# Patient Record
Sex: Female | Born: 1970 | Race: White | Hispanic: No | Smoking: Never smoker
Health system: Southern US, Community
[De-identification: ages and names within clinical notes are randomized; demographics above are authoritative.]

## PROBLEM LIST (undated history)

## (undated) DIAGNOSIS — F419 Anxiety disorder, unspecified: Secondary | ICD-10-CM

## (undated) DIAGNOSIS — G5620 Lesion of ulnar nerve, unspecified upper limb: Secondary | ICD-10-CM

## (undated) HISTORY — DX: Anxiety disorder, unspecified: F41.9

## (undated) HISTORY — DX: Lesion of ulnar nerve, unspecified upper limb: G56.20

## (undated) HISTORY — PX: TUBAL LIGATION: SHX77

---

## 2007-01-06 IMAGING — CR DG FOREARM 2V*L*
2 series · 2 of 2 positions shown · non-contrast
Comparison: none

CLINICAL DATA: Patient fell and is having forearm pain in the mid and distal forearm.
 LEFT [id number] VIEW:
 No prior studies.

[w forearm ap left]
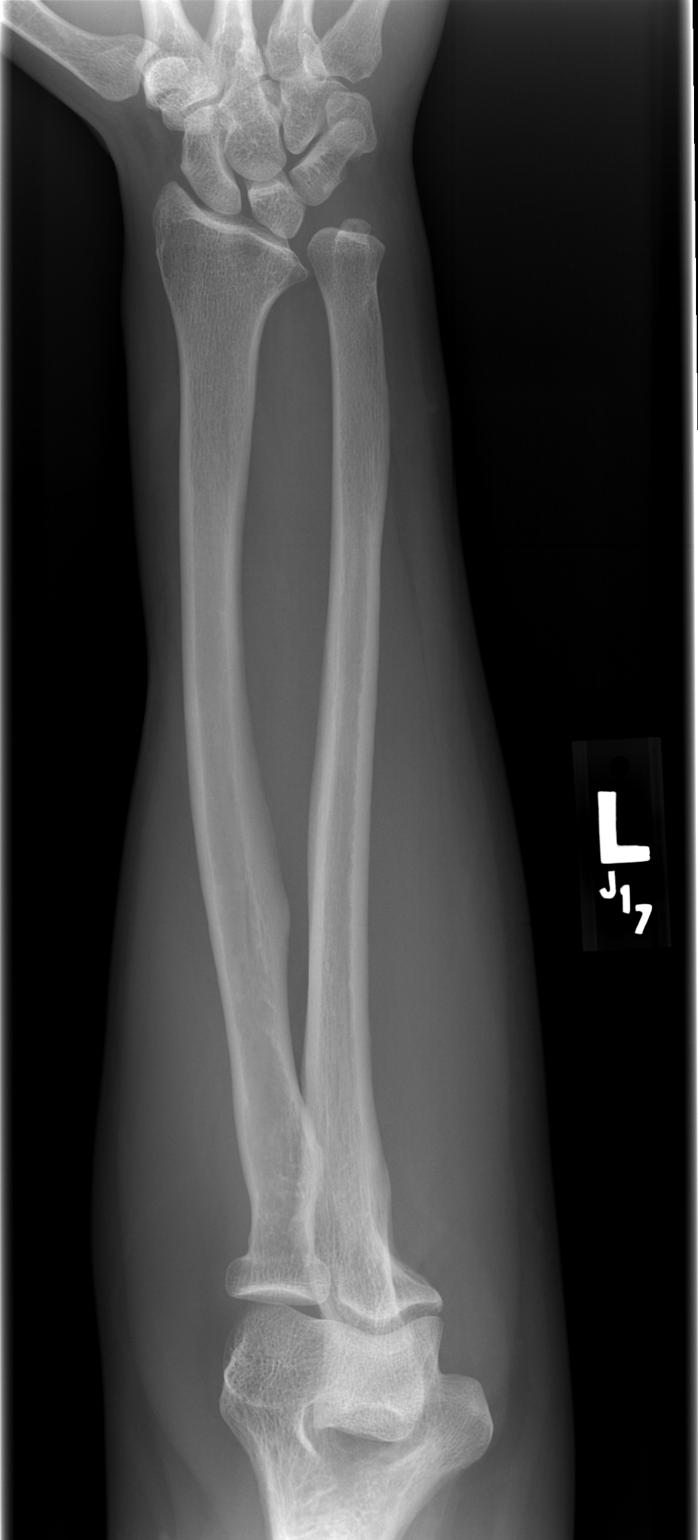

[w forearm lat left]
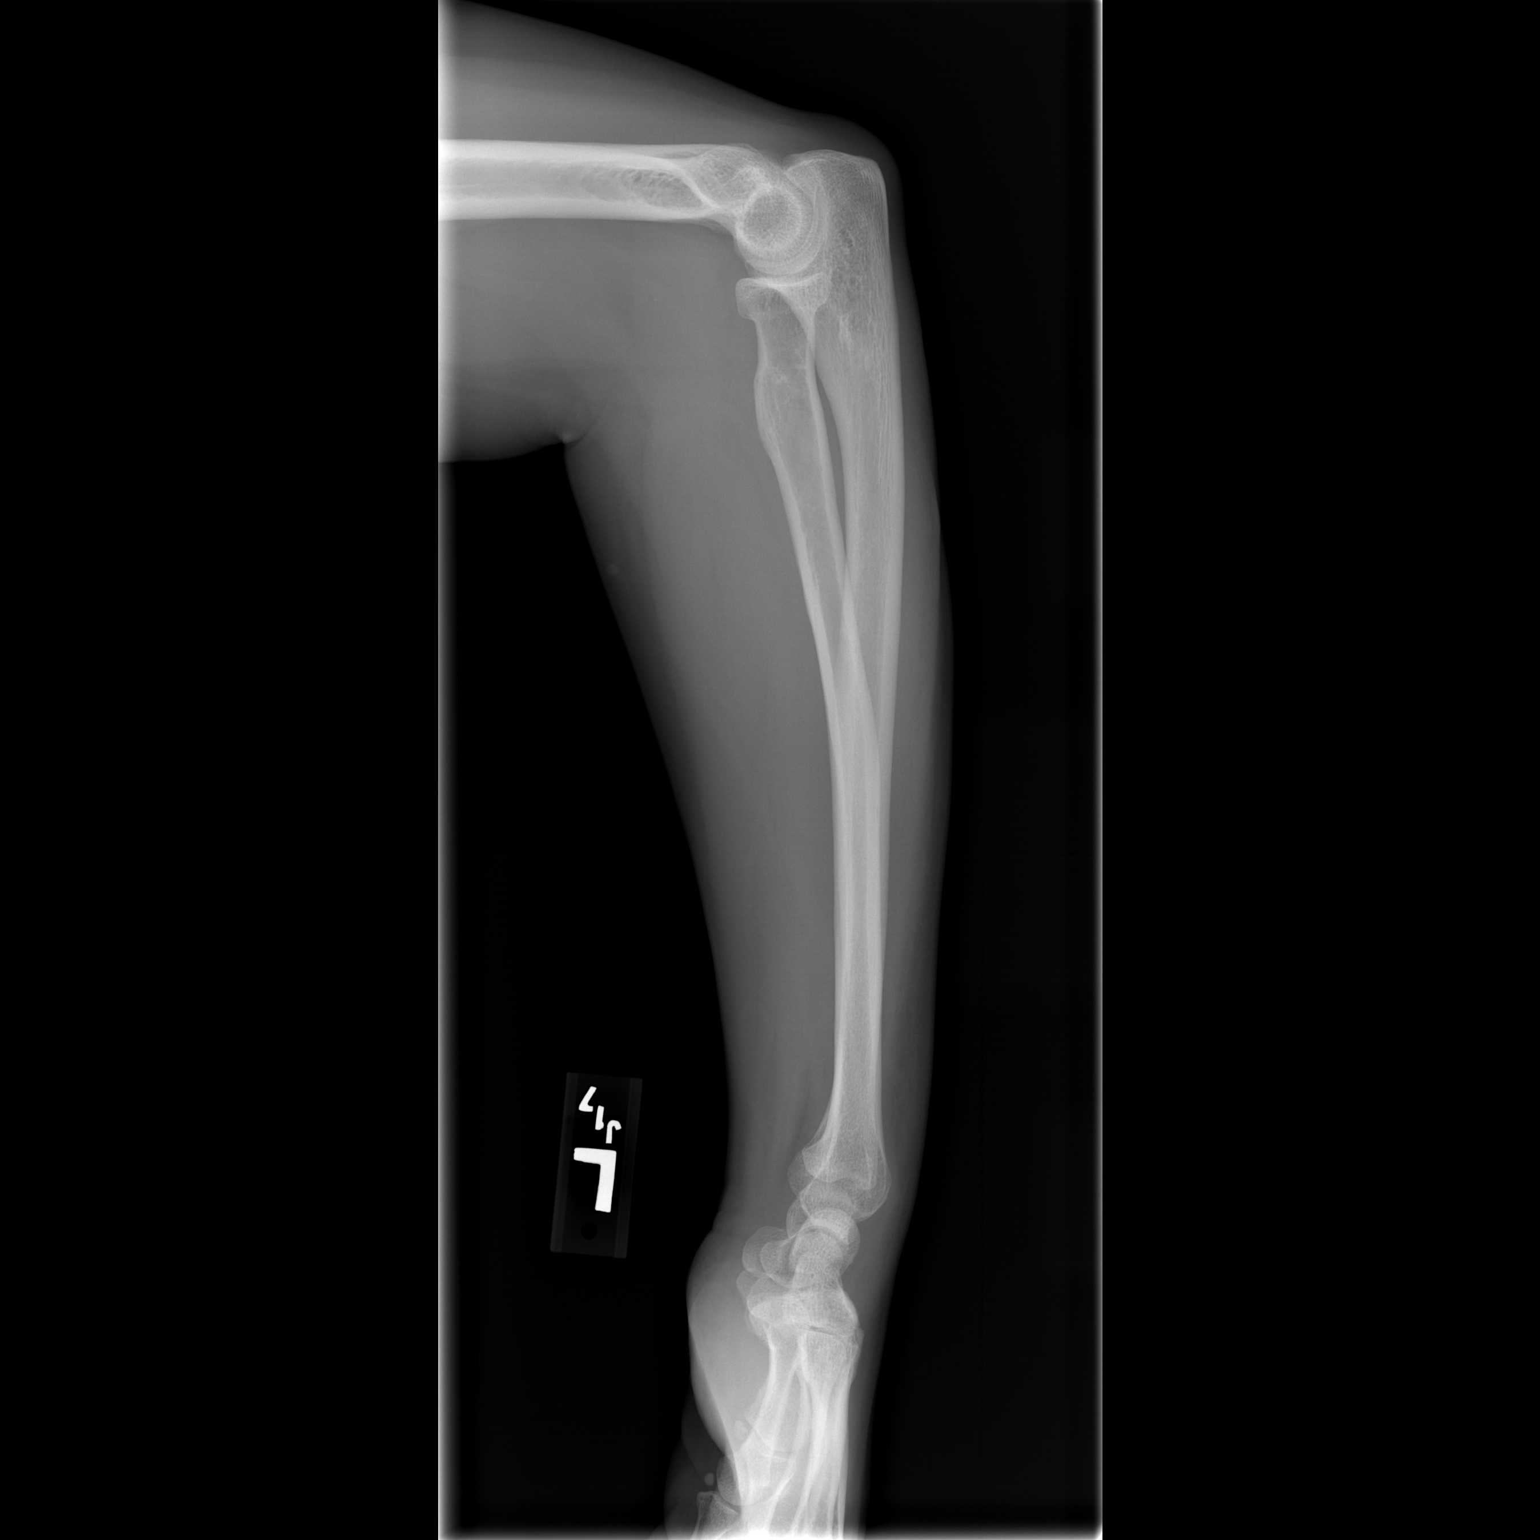

[2 of 2 positions shown; findings below may reference images not displayed]

FINDINGS: The medial portion of the distal radius has a somewhat scalloped appearance and deviates proximally, with an appearance suspicious for deformity associated with remote fracture.  Correlate with any history of fracture.  The lunate has a spurred and convex inferior margin which contours to the form of this unusual distal radius.  There is clearly some soft tissue swelling along the wrist.  No shaft fracture of the radius or ulna is identified.
IMPRESSION: Appearance suggesting deformity of the distal radius, with flattening of the medial aspect of the distal radius and proximal angulation of the articular surface of the medial radius.  This may well be old given the shape of the lunate, but there is clearly some regional soft tissue swelling.  Consider CT or MR for further evaluation.
 LEFT [id number] VIEW:
FINDINGS: Again noted is deformity of the distal radius with proximal angulation of the medial radial articular surface, inferior spurring of the lunate, and possibly widening of the distal radial ulnar articulation. No obvious cortical discontinuity but there is regional soft tissue swelling.  Cannot exclude injury of the distal radial ulnar joint. Consider MRI for further characterization.
IMPRESSION: Abnormal scalloped contour at the medial distal radial articular surface, with regional soft tissue swelling. Consider MRI if pain persists.

## 2015-03-11 DIAGNOSIS — G43119 Migraine with aura, intractable, without status migrainosus: Secondary | ICD-10-CM | POA: Insufficient documentation

## 2015-03-11 DIAGNOSIS — G4440 Drug-induced headache, not elsewhere classified, not intractable: Secondary | ICD-10-CM | POA: Insufficient documentation

## 2016-06-21 DIAGNOSIS — R102 Pelvic and perineal pain: Secondary | ICD-10-CM | POA: Insufficient documentation

## 2018-10-22 ENCOUNTER — Other Ambulatory Visit: Payer: Self-pay

## 2018-10-22 ENCOUNTER — Ambulatory Visit: Payer: Managed Care, Other (non HMO) | Admitting: Neurology

## 2018-10-22 ENCOUNTER — Encounter: Payer: Self-pay | Admitting: Neurology

## 2018-10-22 VITALS — BP 134/67 | HR 93 | Ht 66.0 in | Wt 218.0 lb

## 2018-10-22 DIAGNOSIS — G43119 Migraine with aura, intractable, without status migrainosus: Secondary | ICD-10-CM

## 2018-10-22 MED ORDER — UBRELVY 50 MG PO TABS: 50.0000 mg | ORAL_TABLET | ORAL | 3 refills | Status: AC | PRN

## 2018-10-22 NOTE — Progress Notes (Signed)
Subjective:    Patient ID: Sheila Rush is a 48 y.o. female.  HPI     Sheila FoleySaima Ej Pinson, MD, PhD [facility] [address], [address] [address] [place], [place] [id number]  Dear Dr. Yetta FlockHodges, I saw your patient, Sheila Rush, upon your kind request in my neurologic clinic today for initial consultation of her recurrent headaches, concern for migraines.  The patient is unaccompanied today.  As you know, Sheila Rush is a 48 year old right-handed woman with an underlying medical history of anxiety, and obesity, who reports recurrent headaches for the past several years.  I reviewed your office note from 08/05/2018.  She has been tried on several medications.  She has had a brain MRA recently.  We [facility] the report from [facility].  She had a brain MRA without contrast on 07/26/2018 and I reviewed the results: Impression: Normal intracranial MRA.  No aneurysm.  She reports a prior diagnosis of an aneurysm.  I reviewed prior records.  She reports having [facility] neurolog[facility] the [facility].  I was able to review a consultation note by Dr[facility] Loyal Gamblereborah Kirby [facility] from 03/11/[facility].  Patient was treated for migraine prevention with Topamax at the time and was also given a prescription for tizanidine in March 2017.  No other records are available for my review today.  Patient reports that she has tried multiple triptan's but she had a reaction including throat swelling to sumatriptan and.  She has tried Maxalt in the past as per prior records.  She was also tried on Compazine in the past, took Excedrin over-the-counter as well as Tylenol over-the-counter and there was mentioning of analgesic rebound headache.  She had a prescription for tramadol in the past.  She was on Cymbalta and clonazepam in the recent past for anxiety and depression.  She reports that this was situational.  She is no longer on Cymbalta or clonazepam.  She reports that she did not get a refill for  the Cymbalta.  She is divorced since April 2020.  She lives alone.  She does snore and has woken herself up with snoring.  She has 3 grown children.  She works for [employer].  She denies morning headaches or night to night nocturia.  She has never had a sleep study.  Her headache frequency is currently around 8/month with migrainous components including photophobia, occasional nausea, typically no vomiting, throbbing headache reported, mostly left-sided.  She has had migraines for years.  She reports that she had migraine onset in the early 7240s.  [facility] indicate that she had migraines since her 30s.  She reports no associated neurological accompaniments, typically does have a visual aura.  She gets her eyes checked every 2 years and is due this year.  She has prescription eyeglasses but finds them too strong and uses over-the-counter readers.  She is no longer on butalbital.  She was started on gabapentin and Toprol all about 2 months ago she indicates.  She believes that these are helpful.  She is taking gabapentin 300 mg at night and metoprolol 25 mg daily.  She has been using as needed Benadryl.   Her Epworth sleepiness score is 2 out of 24, fatigue severity score is 10 out of 63.  She does not believe she has tried amitriptyline for headache prevention or propranolol.  She has not been on Depakote but did get a Depacon infusion once as per prior record.  She was also treated in the past with Compazine and steroid Dosepak.    Her Past Medical History Is Significant For: Past Medical History:  Diagnosis Date  . Anxiety   . Ulnar neuropathy     Her Past Surgical History Is Significant For: Past Surgical History:  Procedure Laterality Date  . TUBAL LIGATION      Her Family History Is Significant For: History reviewed. No pertinent family history.  Her Social History Is Significant For: Social History   Socioeconomic History  . Marital status:  Divorced    Spouse name: Not on file  . Number of children: Not on file  . Years of education: Not on file  . Highest education level: Not on file  Occupational History  . Not on file  Social Needs  . Financial resource strain: Not on file  . Food insecurity    Worry: Not on file    Inability: Not on file  . Transportation needs    Medical: Not on file    Non-medical: Not on file  Tobacco Use  . Smoking status: Never Smoker  . Smokeless tobacco: Never Used  Substance and Sexual Activity  . Alcohol use: Never    Frequency: Never  . Drug use: Not on file  . Sexual activity: Not on file  Lifestyle  . Physical activity    Days per week: Not on file    Minutes per session: Not on file  . Stress: Not on file  Relationships  . Social Herbalist on phone: Not on file    Gets together: Not on file    Attends religious service: Not on file    Active member of club or organization: Not on file    Attends meetings of clubs or organizations: Not on file    Relationship status: Not on file  Other Topics Concern  . Not on file  Social History Narrative  . Not on file    Her Allergies Are:  Allergies  Allergen Reactions  . Imitrex [Sumatriptan]   . Sulfa Antibiotics   :   Her Current Medications Are:  Outpatient Encounter Medications as of 10/22/2018  Medication Sig  . diphenhydrAMINE (BENADRYL) 25 MG tablet Take 25 mg by mouth every 6 (six) hours as needed.  . gabapentin (NEURONTIN) 100 MG capsule Take 100 mg by mouth at bedtime.   . metoprolol succinate (TOPROL-XL) 25 MG 24 hr tablet Take 25 mg by mouth daily.  Marland Kitchen Ubrogepant (UBRELVY) 50 MG TABS Take 50 mg by mouth as needed (take one at onset of migraine, may repeat once after 2 hours, no more than 2 pills in 24 h.).  . [DISCONTINUED] Butalbital-Acetaminophen 50-300 MG CAPS Take by mouth.  . [DISCONTINUED] SUMAtriptan (IMITREX) 100 MG tablet Take 100 mg by mouth every 2 (two) hours as needed for migraine. May  repeat in 2 hours if headache persists or recurs.   No facility-administered encounter medications on file as of 10/22/2018.   :   Review of Systems:  Out of a complete 14 point review of systems, all are reviewed and negative with the exception of these symptoms as listed below:  Review of Systems  Neurological:       Pt presents today to discuss her migraines. Pt gets at least 8 migraines per month that last more than four hours. Pt has associated light and sound sensitivity. Pt does have nausea and vomiting with her migraine. Pt tried imitrex but it caused trouble  breathing. Pt has never had a sleep study but does endorse snoring.  Epworth Sleepiness Scale 0= would never doze 1= slight chance of dozing 2= moderate chance of dozing 3= high chance of dozing  Sitting and reading: 0 Watching TV: 1 Sitting inactive in a public place (ex. Theater or meeting):0 As a passenger in a car for an hour without a break: 0 Lying down to rest in the afternoon: 1 Sitting and talking to someone: 0 Sitting quietly after lunch (no alcohol): 0 In a car, while stopped in traffic: 0 Total: 2     Objective:  Neurological Exam  Physical Exam Physical Examination:   Vitals:   10/22/18 1004  BP: 134/67  Pulse: 93   General Examination: The patient is a very pleasant 48 y.o. female in no acute distress. She appears well-developed and well-nourished and well groomed.   HEENT: Normocephalic, atraumatic, pupils are equal, round and reactive to light and accommodation. Funduscopic exam is normal with sharp disc margins noted. Extraocular tracking is good without limitation to gaze excursion or nystagmus noted. Normal smooth pursuit is noted. Hearing is grossly intact. Face is symmetric with normal facial animation and normal facial sensation. Speech is clear with no dysarthria noted. There is no hypophonia. There is no lip, neck/head, jaw or voice tremor. Neck is supple with full range of passive and  active motion. There are no carotid bruits on auscultation. Oropharynx exam reveals: mild mouth dryness, adequate dental hygiene and mild airway crowding, due to Small airway entry, tongue protrudes centrally in palate elevates symmetrically.  Neck circumference is 15-5/8 inches.  Tonsils are small.  Chest: Clear to auscultation without wheezing, rhonchi or crackles noted.  Heart: S1+S2+0, regular and normal without murmurs, rubs or gallops noted.   Abdomen: Soft, non-tender and non-distended with normal bowel sounds appreciated on auscultation.  Extremities: There is no pitting edema in the distal lower extremities bilaterally.  Skin: Warm and dry without trophic changes noted.  Musculoskeletal: exam reveals no obvious joint deformities, tenderness or joint swelling or erythema.   Neurologically:  Mental status: The patient is awake, alert and oriented in all 4 spheres. Her immediate and remote memory, attention, language skills and fund of knowledge are appropriate. There is no evidence of aphasia, agnosia, apraxia or anomia. Speech is clear with normal prosody and enunciation. Thought process is linear. Mood is normal and affect is normal.  Cranial nerves II - XII are as described above under HEENT exam. In addition: shoulder shrug is normal with equal shoulder height noted. Motor exam: Normal bulk, strength and tone is noted. There is no drift, tremor or rebound. Romberg is negative. Reflexes are 2+ throughout. Babinski: Toes are flexor bilaterally. Fine motor skills and coordination: intact with normal finger taps, normal hand movements, normal rapid alternating patting, normal foot taps and normal foot agility.  Cerebellar testing: No dysmetria or intention tremor on finger to nose testing. Heel to shin is unremarkable bilaterally. There is no truncal or gait ataxia.  Sensory exam: intact to light touch in the upper and lower extremities.  Gait, station and balance: She stands easily. No  veering to one side is noted. No leaning to one side is noted. Posture is age-appropriate and stance is narrow based. Gait shows normal stride length and normal pace. No problems turning are noted. Tandem walk is unremarkable.   Assessment and Plan:  In summary, Sheila Rush is a very pleasant 48 y.o.-year old female with an underlying medical history of anxiety, and  obesity, who Presents for evaluation of her recurrent headaches.  Her history is suggestive of migraines with aura, currently intractable without status.  She has been tried on Topamax in the past, she used to see Dr. Craige Cotta at [facility].  She has tried several different triptans at least Maxalt and sumatriptan hand.  She had side effects with the sumatriptan hand.  She has tried over-the-counter medications and also Compazine for nausea and had treatment with prednisone Dosepak and IV valproic acid in the past.  She is currently on a beta-blocker and gabapentin which were recently started within the past 2 months or so.  She has noticed some improvement in the intensity of her headache.  She has not tried 1 of the newer as needed medications, to that end, I suggested she start Ubrelvy 50 mg prnFor acute migraine.  For prevention we can consider 1 of the newer injectables such as Aimovig or Emgality or AjovyFor future reference.  She is for now advised to continue with her preventative medications as prescribed by you. Given her obesity and snoring and increase in headaches she should consider a sleep study to rule out obstructive sleep apnea.  She may be at risk for it and she was advised to proceed with a sleep study.  She declines it but is encouraged to consider it.  She is no longer on Cymbalta but indicates that she benefited from it at the time and ran out of the prescription.  She is encouraged to talk to about management of her anxiety and depression. She reportedly had a prior MRI.  She also reports that she was told she had a brain  aneurysm, small as far she recalls.  She had a recent MRA which did not suggest any aneurysm.  Prior office note from Dr. Craige Cotta indicates that she had a venous anomaly.  Her neurological exam is nonfocal and she is reassured.  She is advised to follow through with her eye examination as she goes every 2 years and she may be due at this time.I advised her to follow-up in 3 months with 1 of our nurse practitioners.  I provided a new prescription and written instructions for Ubrelvy.  We talked about headache triggers.  She has noted barometric pressure changes as triggers as well as sleep deprivation and caffeine.  For now she will follow-up in 3 months, she is encouraged to call our office with any interim questions or concerns or if she is ready to pursue a sleep study.  I answered all her questions today and she was in agreement. Thank you very much for allowing me to participate in the care of this nice patient. If I can be of any further assistance to you please do not hesitate to call me at [phone].  Sincerely,   Sheila Foley, MD, PhD

## 2018-10-22 NOTE — Patient Instructions (Signed)
Please continue with the gabapentin and metoprolol as prescribed by Dr. Nyra Capes.  For as needed use for migraine: Start Ubrelvy, 50 mg strength: Take 1 pill at onset of migraine headache, may repeat in 2 hours, no more than 2 pills in 24 hours for now. May cause sedation and nausea.  We can consider 1 of the newer injectable medications, including Aimovig, Emgality, and Ajovy migraine prevention in the future.  Please think about doing a sleep study to rule out obstructive sleep apnea which can cause flareup and headaches and increased migraines. You may be at risk for sleep apnea.

## 2019-02-05 ENCOUNTER — Ambulatory Visit: Payer: Managed Care, Other (non HMO) | Admitting: Family Medicine

## 2021-06-29 ENCOUNTER — Encounter: Payer: Self-pay | Admitting: Family Medicine

## 2021-08-16 DIAGNOSIS — F4310 Post-traumatic stress disorder, unspecified: Secondary | ICD-10-CM | POA: Diagnosis not present

## 2021-08-16 DIAGNOSIS — Z6837 Body mass index (BMI) 37.0-37.9, adult: Secondary | ICD-10-CM | POA: Diagnosis not present

## 2021-10-20 DIAGNOSIS — Z23 Encounter for immunization: Secondary | ICD-10-CM | POA: Diagnosis not present

## 2021-10-20 DIAGNOSIS — Z13220 Encounter for screening for lipoid disorders: Secondary | ICD-10-CM | POA: Diagnosis not present

## 2021-10-20 DIAGNOSIS — Z0000 Encounter for general adult medical examination without abnormal findings: Secondary | ICD-10-CM | POA: Diagnosis not present

## 2021-10-20 DIAGNOSIS — Z6837 Body mass index (BMI) 37.0-37.9, adult: Secondary | ICD-10-CM | POA: Diagnosis not present

## 2021-10-20 DIAGNOSIS — Z131 Encounter for screening for diabetes mellitus: Secondary | ICD-10-CM | POA: Diagnosis not present

## 2021-10-20 DIAGNOSIS — Z79899 Other long term (current) drug therapy: Secondary | ICD-10-CM | POA: Diagnosis not present

## 2021-11-05 DIAGNOSIS — Z1212 Encounter for screening for malignant neoplasm of rectum: Secondary | ICD-10-CM | POA: Diagnosis not present

## 2021-11-05 DIAGNOSIS — Z1211 Encounter for screening for malignant neoplasm of colon: Secondary | ICD-10-CM | POA: Diagnosis not present

## 2021-12-06 DIAGNOSIS — Z6838 Body mass index (BMI) 38.0-38.9, adult: Secondary | ICD-10-CM | POA: Diagnosis not present

## 2021-12-06 DIAGNOSIS — F4310 Post-traumatic stress disorder, unspecified: Secondary | ICD-10-CM | POA: Diagnosis not present

## 2021-12-06 DIAGNOSIS — G43109 Migraine with aura, not intractable, without status migrainosus: Secondary | ICD-10-CM | POA: Diagnosis not present

## 2021-12-19 DIAGNOSIS — G43109 Migraine with aura, not intractable, without status migrainosus: Secondary | ICD-10-CM | POA: Diagnosis not present

## 2021-12-19 DIAGNOSIS — Z6838 Body mass index (BMI) 38.0-38.9, adult: Secondary | ICD-10-CM | POA: Diagnosis not present

## 2022-02-06 DIAGNOSIS — U071 COVID-19: Secondary | ICD-10-CM | POA: Diagnosis not present

## 2022-04-28 DIAGNOSIS — G932 Benign intracranial hypertension: Secondary | ICD-10-CM | POA: Diagnosis not present

## 2022-07-06 DIAGNOSIS — E663 Overweight: Secondary | ICD-10-CM | POA: Diagnosis not present

## 2022-07-06 DIAGNOSIS — E559 Vitamin D deficiency, unspecified: Secondary | ICD-10-CM | POA: Diagnosis not present

## 2022-07-06 DIAGNOSIS — G932 Benign intracranial hypertension: Secondary | ICD-10-CM | POA: Diagnosis not present

## 2022-07-06 DIAGNOSIS — G4489 Other headache syndrome: Secondary | ICD-10-CM | POA: Diagnosis not present

## 2022-07-06 DIAGNOSIS — R7989 Other specified abnormal findings of blood chemistry: Secondary | ICD-10-CM | POA: Diagnosis not present

## 2022-07-06 DIAGNOSIS — G43E19 Chronic migraine with aura, intractable, without status migrainosus: Secondary | ICD-10-CM | POA: Diagnosis not present

## 2022-07-06 DIAGNOSIS — R519 Headache, unspecified: Secondary | ICD-10-CM | POA: Diagnosis not present

## 2022-07-07 DIAGNOSIS — E663 Overweight: Secondary | ICD-10-CM | POA: Insufficient documentation

## 2022-07-07 DIAGNOSIS — G932 Benign intracranial hypertension: Secondary | ICD-10-CM | POA: Insufficient documentation

## 2022-07-07 DIAGNOSIS — E559 Vitamin D deficiency, unspecified: Secondary | ICD-10-CM | POA: Insufficient documentation

## 2022-07-07 DIAGNOSIS — R7989 Other specified abnormal findings of blood chemistry: Secondary | ICD-10-CM | POA: Insufficient documentation

## 2022-07-07 DIAGNOSIS — G43E19 Chronic migraine with aura, intractable, without status migrainosus: Secondary | ICD-10-CM | POA: Insufficient documentation

## 2022-09-22 ENCOUNTER — Encounter: Payer: Self-pay | Admitting: Cardiology

## 2022-09-26 ENCOUNTER — Ambulatory Visit: Payer: BC Managed Care – PPO | Attending: Cardiology | Admitting: Cardiology

## 2022-09-26 ENCOUNTER — Ambulatory Visit: Payer: BC Managed Care – PPO | Attending: Cardiology

## 2022-09-26 VITALS — BP 112/78 | HR 77 | Ht 64.0 in | Wt 235.2 lb

## 2022-09-26 DIAGNOSIS — E785 Hyperlipidemia, unspecified: Secondary | ICD-10-CM | POA: Diagnosis not present

## 2022-09-26 DIAGNOSIS — R002 Palpitations: Secondary | ICD-10-CM

## 2022-09-26 DIAGNOSIS — Z7689 Persons encountering health services in other specified circumstances: Secondary | ICD-10-CM

## 2022-09-26 DIAGNOSIS — R0789 Other chest pain: Secondary | ICD-10-CM | POA: Diagnosis not present

## 2022-09-26 DIAGNOSIS — R072 Precordial pain: Secondary | ICD-10-CM

## 2022-09-26 DIAGNOSIS — R0609 Other forms of dyspnea: Secondary | ICD-10-CM | POA: Diagnosis not present

## 2022-09-26 MED ORDER — METOPROLOL TARTRATE 100 MG PO TABS: ORAL_TABLET | ORAL | 0 refills | Status: DC

## 2022-09-26 MED ORDER — ASPIRIN 81 MG PO TBEC: 81.0000 mg | DELAYED_RELEASE_TABLET | Freq: Every day | ORAL | Status: AC

## 2022-09-26 NOTE — Addendum Note (Signed)
Addended by: Baldo Ash D on: 09/26/2022 11:03 AM   Modules accepted: Orders

## 2022-09-26 NOTE — Patient Instructions (Signed)
Medication Instructions:   START: Aspirin 81mg  1 tablet daily  TAKE: Metoprolol 100mg  1 tablet 2 hours prior to CT Scan  *If you need a refill on your cardiac medications before your next appointment, please call your pharmacy*   Lab Work: None ordered If you have labs (blood work) drawn today and your tests are completely normal, you will receive your results only by: MyChart Message (if you have MyChart) OR A paper copy in the mail If you have any lab test that is abnormal or we need to change your treatment, we will call you to review the results.   Testing/Procedures:  Your physician has requested that you have an echocardiogram. Echocardiography is a painless test that uses sound waves to create images of your heart. It provides your doctor with information about the size and shape of your heart and how well your heart's chambers and valves are working. This procedure takes approximately one hour. There are no restrictions for this procedure. Please do NOT wear cologne, perfume, aftershave, or lotions (deodorant is allowed). Please arrive 15 minutes prior to your appointment time.    WHY IS MY DOCTOR PRESCRIBING ZIO? The Zio system is proven and trusted by physicians to detect and diagnose irregular heart rhythms -- and has been prescribed to hundreds of thousands of patients.  The FDA has cleared the Zio system to monitor for many different kinds of irregular heart rhythms. In a study, physicians were able to reach a diagnosis 90% of the time with the Zio system1.  You can wear the Zio monitor -- a small, discreet, comfortable patch -- during your normal day-to-day activity, including while you sleep, shower, and exercise, while it records every single heartbeat for analysis.  1Barrett, P., et al. Comparison of 24 Hour Holter Monitoring Versus 14 Day Novel Adhesive Patch Electrocardiographic Monitoring. American Journal of Medicine, 2014.  ZIO VS. HOLTER MONITORING The Zio  monitor can be comfortably worn for up to 14 days. Holter monitors can be worn for 24 to 48 hours, limiting the time to record any irregular heart rhythms you may have. Zio is able to capture data for the 51% of patients who have their first symptom-triggered arrhythmia after 48 hours.1  LIVE WITHOUT RESTRICTIONS The Zio ambulatory cardiac monitor is a small, unobtrusive, and water-resistant patch--you might even forget you're wearing it. The Zio monitor records and stores every beat of your heart, whether you're sleeping, working out, or showering.   Your physician has requested that you have cardiac CT. Cardiac computed tomography (CT) is a painless test that uses an x-ray machine to take clear, detailed pictures of your heart. For further information please visit [email or web address] Please follow instruction sheet as given.    Your Cardiac CT will be scheduled at:   [facility] located off [facility] at the hospital.  Please arrive 30 minutes prior to your appointment time.  You can use the FREE valet parking offered at entrance to outpatient center (encouraged to control the heart rate for the test)   Please follow these instructions carefully (unless otherwise directed):    On the Night Before the Test: Be sure to Drink plenty of water. Do not consume any caffeinated/decaffeinated beverages or chocolate 12 hours prior to your test. Do not take any antihistamines 12 hours prior to your test.   On the Day of the Test: Drink plenty of water until 1 hour prior to the test. Do not eat any food 4 hours prior to the  test. No smoking 4 hours prior to test. You may take your regular medications prior to the test.  Take metoprolol (Lopressor) two hours prior to test. FEMALES- please wear underwire-free bra if available, avoid dresses & tight clothing. Wear plain shirt no beads, sparkles, rhinestones, metal or heavy embroidery.  After the Test: Drink plenty of  water. After receiving IV contrast, you may experience a mild flushed feeling. This is normal. On occasion, you may experience a mild rash up to 24 hours after the test. This is not dangerous. If this occurs, you can take Benadryl 25 mg and increase your fluid intake. If you experience trouble breathing, this can be serious. If it is severe call 911 IMMEDIATELY. If it is mild, please call our office. If you take any of these medications: Glipizide/Metformin, Avandament, Glucavance, please do not take 48 hours after completing test unless otherwise instructed.  We will call to schedule your test 2-4 weeks out understanding that some insurance companies will need an authorization prior to the service being performed.      Follow-Up: At [facility], you and your health needs are our priority.  As part of our continuing mission to provide you with exceptional heart care, we have created designated Provider Care Teams.  These Care Teams include your primary Cardiologist (physician) and Advanced Practice Providers (APPs -  Physician Assistants and Nurse Practitioners) who all work together to provide you with the care you need, when you need it.  We recommend signing up for the patient portal called "MyChart".  Sign up information is provided on this After Visit Summary.  MyChart is used to connect with patients for Virtual Visits (Telemedicine).  Patients are able to view lab/test results, encounter notes, upcoming appointments, etc.  Non-urgent messages can be sent to your provider as well.   To learn more about what you can do with MyChart, go to [email or web address].    Your next appointment:   2 month(s)  The format for your next appointment:   In Person  Provider:   Gypsy Balsam, MD    Other Instructions Cardiac CT Angiogram A cardiac CT angiogram is a procedure to look at the heart and the area around the heart. It may be done to help find the cause of chest pains or  other symptoms of heart disease. During this procedure, a substance called contrast dye is injected into the blood vessels in the area to be checked. A large X-ray machine, called a CT scanner, then takes detailed pictures of the heart and the surrounding area. The procedure is also sometimes called a coronary CT angiogram, coronary artery scanning, or CTA. A cardiac CT angiogram allows the health care provider to see how well blood is flowing to and from the heart. The health care provider will be able to see if there are any problems, such as: Blockage or narrowing of the coronary arteries in the heart. Fluid around the heart. Signs of weakness or disease in the muscles, valves, and tissues of the heart. Tell a health care provider about: Any allergies you have. This is especially important if you have had a previous allergic reaction to contrast dye. All medicines you are taking, including vitamins, herbs, eye drops, creams, and over-the-counter medicines. Any blood disorders you have. Any surgeries you have had. Any medical conditions you have. Whether you are pregnant or may be pregnant. Any anxiety disorders, chronic pain, or other conditions you have that may increase your stress or prevent you from  lying still. What are the risks? Generally, this is a safe procedure. However, problems may occur, including: Bleeding. Infection. Allergic reactions to medicines or dyes. Damage to other structures or organs. Kidney damage from the contrast dye that is used. Increased risk of cancer from radiation exposure. This risk is low. Talk with your health care provider about: The risks and benefits of testing. How you can receive the lowest dose of radiation. What happens before the procedure? Wear comfortable clothing and remove any jewelry, glasses, dentures, and hearing aids. Follow instructions from your health care provider about eating and drinking. This may include: For 12 hours before the  procedure -- avoid caffeine. This includes tea, coffee, soda, energy drinks, and diet pills. Drink plenty of water or other fluids that do not have caffeine in them. Being well hydrated can prevent complications. For 4-6 hours before the procedure -- stop eating and drinking. The contrast dye can cause nausea, but this is less likely if your stomach is empty. Ask your health care provider about changing or stopping your regular medicines. This is especially important if you are taking diabetes medicines, blood thinners, or medicines to treat problems with erections (erectile dysfunction). What happens during the procedure?  Hair on your chest may need to be removed so that small sticky patches called electrodes can be placed on your chest. These will transmit information that helps to monitor your heart during the procedure. An IV will be inserted into one of your veins. You might be given a medicine to control your heart rate during the procedure. This will help to ensure that good images are obtained. You will be asked to lie on an exam table. This table will slide in and out of the CT machine during the procedure. Contrast dye will be injected into the IV. You might feel warm, or you may get a metallic taste in your mouth. You will be given a medicine called nitroglycerin. This will relax or dilate the arteries in your heart. The table that you are lying on will move into the CT machine tunnel for the scan. The person running the machine will give you instructions while the scans are being done. You may be asked to: Keep your arms above your head. Hold your breath. Stay very still, even if the table is moving. When the scanning is complete, you will be moved out of the machine. The IV will be removed. The procedure may vary among health care providers and hospitals. What can I expect after the procedure? After your procedure, it is common to have: A metallic taste in your mouth from the  contrast dye. A feeling of warmth. A headache from the nitroglycerin. Follow these instructions at home: Take over-the-counter and prescription medicines only as told by your health care provider. If you are told, drink enough fluid to keep your urine pale yellow. This will help to flush the contrast dye out of your body. Most people can return to their normal activities right after the procedure. Ask your health care provider what activities are safe for you. It is up to you to get the results of your procedure. Ask your health care provider, or the department that is doing the procedure, when your results will be ready. Keep all follow-up visits as told by your health care provider. This is important. Contact a health care provider if: You have any symptoms of allergy to the contrast dye. These include: Shortness of breath. Rash or hives. A racing heartbeat. Summary  A cardiac CT angiogram is a procedure to look at the heart and the area around the heart. It may be done to help find the cause of chest pains or other symptoms of heart disease. During this procedure, a large X-ray machine, called a CT scanner, takes detailed pictures of the heart and the surrounding area after a contrast dye has been injected into blood vessels in the area. Ask your health care provider about changing or stopping your regular medicines before the procedure. This is especially important if you are taking diabetes medicines, blood thinners, or medicines to treat erectile dysfunction. If you are told, drink enough fluid to keep your urine pale yellow. This will help to flush the contrast dye out of your body. This information is not intended to replace advice given to you by your health care provider. Make sure you discuss any questions you have with your health care provider. Document Revised: 05/05/2021 Document Reviewed: 09/11/2018 Elsevier Patient Education  2023 Elsevier Inc.   Important Information About  Sugar

## 2022-09-26 NOTE — Progress Notes (Signed)
Cardiology Consultation:    Date:  09/26/2022   ID:  Nyoka, Clippard 1970/11/01, MRN [id number]  PCP:  Abner Greenspan, MD  Cardiologist:  Gypsy Balsam, MD   Referring MD: Abner Greenspan, MD   Chief Complaint  Patient presents with   Chest Pain   elevated HR    Both ongoing for 1-2 yrs  On Metoprolol but not helping    History of Present Illness:    Sheila Rush is a 52 y.o. female who is being seen today for the evaluation of chest pain at the request of Abner Greenspan, MD. past medical history significant for anxiety, dyslipidemia, migraine headaches.  She was referred to [place] because of episode of chest pain.  She describes chest pain ongoing for about 1 to 3 years chest pain heaviness like sensation lasting sometimes all day.  She grades as 5 on a scale up to 10 mg which she gets this chest pain sessions she also will have some headache especially on the left side.  There is no some shortness of breath associated with this.  But not much swelling.  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  She have to walk a lot to have no difficulty doing it.  Does not practice any sport does not exercise on the regular basis watching her diet.  She never smoked does have family history with multiple family members having premature coronary disease.  Denies have any typical exertional chest pain.  Does have some shortness of breath while exercising  Past Medical History:  Diagnosis Date   Anxiety    Ulnar neuropathy     Past Surgical History:  Procedure Laterality Date   TUBAL LIGATION      Current Medications: Current Meds  Medication Sig   diphenhydrAMINE (BENADRYL) 25 MG tablet Take 25 mg by mouth every 6 (six) hours as needed for itching, allergies or sleep.   gabapentin (NEURONTIN) 100 MG capsule Take 100 mg by mouth at bedtime.    hydrOXYzine (ATARAX) 50 MG tablet Take 50 mg by mouth at bedtime as needed (Sleep).   metoprolol succinate (TOPROL-XL) 25 MG 24 hr tablet  Take 25 mg by mouth daily.   Ubrogepant (UBRELVY) 50 MG TABS Take 50 mg by mouth as needed (take one at onset of migraine, may repeat once after 2 hours, no more than 2 pills in 24 h.).     Allergies:   Imitrex [sumatriptan] and Sulfa antibiotics   Social History   Socioeconomic History   Marital status: Divorced    Spouse name: Not on file   Number of children: Not on file   Years of education: Not on file   Highest education level: Not on file  Occupational History   Not on file  Tobacco Use   Smoking status: Never   Smokeless tobacco: Never  Substance and Sexual Activity   Alcohol use: Never   Drug use: Not on file   Sexual activity: Not on file  Other Topics Concern   Not on file  Social History Narrative   Not on file   Social Determinants of Health   Financial Resource Strain: Not on file  Food Insecurity: Not on file  Transportation Needs: Not on file  Physical Activity: Not on file  Stress: Not on file  Social Connections: Not on file     Family History: The patient's family history is not on file. ROS:   Please see the history of present illness.  All 14 point review of systems negative except as described per history of present illness.  EKGs/Labs/Other Studies Reviewed:    The following studies were reviewed today:   EKG:   Normal sinus rhythm, normal P interval, normal QRS complex duration morphology no ST segment changes  Recent Labs: No results found for requested labs within last 365 days.  Recent Lipid Panel No results found for: "CHOL", "TRIG", "HDL", "CHOLHDL", "VLDL", "LDLCALC", "LDLDIRECT"  Physical Exam:    VS:  BP 112/78 (BP Location: Left Arm, Patient Position: Sitting)   Pulse 77   Ht 5\' 4"  (1.626 m)   Wt 235 lb 3.2 oz (106.7 kg)   SpO2 95%   BMI 40.37 kg/m     Wt Readings from Last 3 Encounters:  09/26/22 235 lb 3.2 oz (106.7 kg)  10/22/18 218 lb (98.9 kg)     GEN:  Well nourished, well developed in no acute  distress HEENT: Normal NECK: No JVD; No carotid bruits LYMPHATICS: No lymphadenopathy CARDIAC: RRR, no murmurs, no rubs, no gallops RESPIRATORY:  Clear to auscultation without rales, wheezing or rhonchi  ABDOMEN: Soft, non-tender, non-distended MUSCULOSKELETAL:  No edema; No deformity  SKIN: Warm and dry NEUROLOGIC:  Alert and oriented x 3 PSYCHIATRIC:  Normal affect   ASSESSMENT:    1. Encounter to establish care   2. Atypical chest pain   3. Palpitations   4. Dyspnea on exertion   5. Dyslipidemia    PLAN:    In order of problems listed above:  Atypical chest pain with this lady with not many risk factors for coronary artery disease only mild dyslipidemia but HDL is elevated.  Never smoked does not have hypertension.  Hemoglobin A1c slightly on the higher side but does not reach criteria for diabetes she does have however significant family history of premature coronary artery disease, therefore, I think we need to evaluate him for presence of coronary disease with her chest pain I think the best test will be coronary CT angio which I will schedule her to. Dyspnea on exertion: Echocardiogram to be done to assess left ventricle ejection fraction. Palpitations, she noticed on her watch that alarms are about fast heart rate.  It can happen anytime.  She also feels palpitations.  Will ask her to wear Zio patch for 2 weeks to see if there is any arrhythmia.  She is already on metoprolol which I will continue in the future may require higher dose of the medication. Anxiety which may be contributing to her symptomatology but before we commit her to this diagnosis I need to make sure there is no organic heart problem, therefore, we will do the above-mentioned tests.   Medication Adjustments/Labs and Tests Ordered: Current medicines are reviewed at length with the patient today.  Concerns regarding medicines are outlined above.  Orders Placed This Encounter  Procedures   EKG 12-Lead   No  orders of the defined types were placed in this encounter.   Signed, Georgeanna Lea, MD, [facility]. 09/26/2022 10:28 AM    [facility]

## 2022-09-27 DIAGNOSIS — F418 Other specified anxiety disorders: Secondary | ICD-10-CM | POA: Diagnosis not present

## 2022-09-27 DIAGNOSIS — G932 Benign intracranial hypertension: Secondary | ICD-10-CM | POA: Diagnosis not present

## 2022-09-27 DIAGNOSIS — Z6835 Body mass index (BMI) 35.0-35.9, adult: Secondary | ICD-10-CM | POA: Diagnosis not present

## 2022-10-05 DIAGNOSIS — R072 Precordial pain: Secondary | ICD-10-CM | POA: Diagnosis not present

## 2022-10-05 DIAGNOSIS — R079 Chest pain, unspecified: Secondary | ICD-10-CM | POA: Diagnosis not present

## 2022-10-11 DIAGNOSIS — R002 Palpitations: Secondary | ICD-10-CM | POA: Diagnosis not present

## 2022-10-20 ENCOUNTER — Encounter: Payer: Self-pay | Admitting: Cardiology

## 2022-10-31 ENCOUNTER — Ambulatory Visit: Payer: BC Managed Care – PPO | Attending: Cardiology

## 2022-11-03 DIAGNOSIS — G932 Benign intracranial hypertension: Secondary | ICD-10-CM | POA: Diagnosis not present

## 2022-11-03 DIAGNOSIS — E66812 Obesity, class 2: Secondary | ICD-10-CM | POA: Diagnosis not present

## 2022-11-03 DIAGNOSIS — Z23 Encounter for immunization: Secondary | ICD-10-CM | POA: Diagnosis not present

## 2022-11-03 DIAGNOSIS — N951 Menopausal and female climacteric states: Secondary | ICD-10-CM | POA: Diagnosis not present

## 2022-11-03 DIAGNOSIS — Z6836 Body mass index (BMI) 36.0-36.9, adult: Secondary | ICD-10-CM | POA: Diagnosis not present

## 2022-11-03 DIAGNOSIS — F418 Other specified anxiety disorders: Secondary | ICD-10-CM | POA: Diagnosis not present

## 2022-11-13 DIAGNOSIS — Z6838 Body mass index (BMI) 38.0-38.9, adult: Secondary | ICD-10-CM | POA: Diagnosis not present

## 2022-11-13 DIAGNOSIS — G932 Benign intracranial hypertension: Secondary | ICD-10-CM | POA: Diagnosis not present

## 2022-11-13 DIAGNOSIS — F418 Other specified anxiety disorders: Secondary | ICD-10-CM | POA: Diagnosis not present

## 2022-11-27 DIAGNOSIS — F418 Other specified anxiety disorders: Secondary | ICD-10-CM | POA: Diagnosis not present

## 2022-11-27 DIAGNOSIS — Z6838 Body mass index (BMI) 38.0-38.9, adult: Secondary | ICD-10-CM | POA: Diagnosis not present

## 2022-12-06 DIAGNOSIS — Z6838 Body mass index (BMI) 38.0-38.9, adult: Secondary | ICD-10-CM | POA: Diagnosis not present

## 2022-12-06 DIAGNOSIS — R519 Headache, unspecified: Secondary | ICD-10-CM | POA: Diagnosis not present

## 2022-12-06 DIAGNOSIS — R7989 Other specified abnormal findings of blood chemistry: Secondary | ICD-10-CM | POA: Diagnosis not present

## 2022-12-06 DIAGNOSIS — R110 Nausea: Secondary | ICD-10-CM | POA: Diagnosis not present

## 2022-12-11 ENCOUNTER — Ambulatory Visit: Payer: BC Managed Care – PPO | Admitting: Cardiology

## 2022-12-12 DIAGNOSIS — J9801 Acute bronchospasm: Secondary | ICD-10-CM | POA: Diagnosis not present

## 2022-12-14 ENCOUNTER — Other Ambulatory Visit: Payer: BC Managed Care – PPO

## 2023-02-02 DIAGNOSIS — Z79899 Other long term (current) drug therapy: Secondary | ICD-10-CM | POA: Diagnosis not present

## 2023-02-02 DIAGNOSIS — R7303 Prediabetes: Secondary | ICD-10-CM | POA: Diagnosis not present

## 2023-02-02 DIAGNOSIS — Z6838 Body mass index (BMI) 38.0-38.9, adult: Secondary | ICD-10-CM | POA: Diagnosis not present

## 2023-02-02 DIAGNOSIS — N951 Menopausal and female climacteric states: Secondary | ICD-10-CM | POA: Diagnosis not present

## 2023-03-07 DIAGNOSIS — R2689 Other abnormalities of gait and mobility: Secondary | ICD-10-CM | POA: Diagnosis not present

## 2023-03-07 DIAGNOSIS — R519 Headache, unspecified: Secondary | ICD-10-CM | POA: Diagnosis not present

## 2023-03-07 DIAGNOSIS — H538 Other visual disturbances: Secondary | ICD-10-CM | POA: Diagnosis not present

## 2023-03-07 DIAGNOSIS — Z5320 Procedure and treatment not carried out because of patient's decision for unspecified reasons: Secondary | ICD-10-CM | POA: Diagnosis not present

## 2023-03-15 DIAGNOSIS — R519 Headache, unspecified: Secondary | ICD-10-CM | POA: Diagnosis not present

## 2023-03-15 DIAGNOSIS — G932 Benign intracranial hypertension: Secondary | ICD-10-CM | POA: Diagnosis not present

## 2023-03-15 DIAGNOSIS — E236 Other disorders of pituitary gland: Secondary | ICD-10-CM | POA: Diagnosis not present

## 2023-03-15 DIAGNOSIS — Z5181 Encounter for therapeutic drug level monitoring: Secondary | ICD-10-CM | POA: Diagnosis not present

## 2023-03-15 DIAGNOSIS — H538 Other visual disturbances: Secondary | ICD-10-CM | POA: Diagnosis not present

## 2023-04-05 DIAGNOSIS — R519 Headache, unspecified: Secondary | ICD-10-CM | POA: Diagnosis not present

## 2023-04-05 DIAGNOSIS — Z6838 Body mass index (BMI) 38.0-38.9, adult: Secondary | ICD-10-CM | POA: Diagnosis not present

## 2023-04-13 DIAGNOSIS — Z6835 Body mass index (BMI) 35.0-35.9, adult: Secondary | ICD-10-CM | POA: Diagnosis not present

## 2023-04-13 DIAGNOSIS — R9409 Abnormal results of other function studies of central nervous system: Secondary | ICD-10-CM | POA: Diagnosis not present

## 2023-04-13 DIAGNOSIS — G932 Benign intracranial hypertension: Secondary | ICD-10-CM | POA: Diagnosis not present

## 2023-04-13 DIAGNOSIS — F4310 Post-traumatic stress disorder, unspecified: Secondary | ICD-10-CM | POA: Diagnosis not present

## 2023-05-03 DIAGNOSIS — R838 Other abnormal findings in cerebrospinal fluid: Secondary | ICD-10-CM | POA: Diagnosis not present

## 2023-05-03 DIAGNOSIS — G932 Benign intracranial hypertension: Secondary | ICD-10-CM | POA: Diagnosis not present

## 2023-05-03 DIAGNOSIS — G43909 Migraine, unspecified, not intractable, without status migrainosus: Secondary | ICD-10-CM | POA: Diagnosis not present

## 2023-05-03 DIAGNOSIS — G43E19 Chronic migraine with aura, intractable, without status migrainosus: Secondary | ICD-10-CM | POA: Diagnosis not present

## 2023-05-03 DIAGNOSIS — E236 Other disorders of pituitary gland: Secondary | ICD-10-CM | POA: Diagnosis not present

## 2023-05-03 DIAGNOSIS — G43119 Migraine with aura, intractable, without status migrainosus: Secondary | ICD-10-CM | POA: Diagnosis not present

## 2023-05-03 DIAGNOSIS — Z79899 Other long term (current) drug therapy: Secondary | ICD-10-CM | POA: Diagnosis not present

## 2023-05-03 DIAGNOSIS — R836 Abnormal cytological findings in cerebrospinal fluid: Secondary | ICD-10-CM | POA: Diagnosis not present

## 2023-05-14 NOTE — Telephone Encounter (Signed)
 Called patient and left message, advised in message of Dr Perri being out 05/16 - appointment rescheduled to 05/09 and provided date time location in voicemail and advised to call back wit questions. Appointment reminder letter will also be sent out 04/14

## 2023-06-08 DIAGNOSIS — G932 Benign intracranial hypertension: Secondary | ICD-10-CM | POA: Diagnosis not present

## 2023-06-08 NOTE — Progress Notes (Signed)
 Ophthalmology Department Clinical Visit Note  06/08/23     CHIEF COMPLAINT Patient presents for Follow Up Exam   HISTORY OF PRESENT ILLNESS: Sheila Rush is a 53 y.o. female with a past medical history of  has a past medical history of Anxiety, CTS (carpal tunnel syndrome), Headache, tension-type, History of migraine, Increased heart rate, Migraine, and Trauma. who presents to clinic for:  HPI     Follow Up Exam           Follow-Up For:: IIH   Laterality: both eyes   Associated Signs & Symptoms:: Va is the same as last seen. When wearing glasses she gets a headache. Headache pain 7 out of 10. Denies any other pains/discomforts. Denies FOL/floaters.          Comments   Gtts: no meds  Needs dr note stating she can go back to work.       Last edited by Nidia Bartley Hutchinson, COA on 06/08/2023  2:30 PM.        HISTORICAL INFORMATION:   Selected notes from the MEDICAL RECORD NUMBER 03/15/23 with me  #Idiopathic Intracranial Hypertension The patient's history and physical exam are consistent with papilledema. The most common cause of papilledema is IIH  - Patient reports headaches with n/v, transient visual obscurations, pulsatile tinnitus, sometimes triggered by valsalva maneuvers. - The patient's BMI is 37 - Review of meds negative for oral contraceptives, tetracyclines, cyclosporine, vitamin A, amiodarone, sulfa abx, lithium, systemic steroids - 12/06/22 last visit with Neurology - Meds include Diamox (initiated 12/06/22) currently 500mg  BID, ubrogepant  PRN, magnesium 400mg  daily, metoprolol  XL 100mg  daily, gabapentin 100mg  TID, Hydroxyzine 50mg  nightly, ibuprofen and aspirin . - Reported MRA in 2020 normal, without aneurysm. - 03/07/23 CT head with expanded relatively empty sella progressed since 2020. - HVF 03/15/23 normal  - OCT RNFL 03/15/23 with mild disc edema OS>OD PLAN - Discussed with patient importance of weight reduction - Continue follow-up with  neurology for management including LP, diamox - Follow-up with ophthalmology 2-3 months for repeat HVF, OCT RFNL, and disc photos - May consider referral to neurosurgery for CSF diversion procedure if ICP unable to be adequately controlled with oral medications and weight loss.     Return for 3 months - Powell/PGY3 IIH thank you.   CURRENT MEDICATIONS: Outpatient Encounter Medications as of 06/08/2023  Medication Sig Dispense Refill  . acetaZOLAMIDE (DIAMOX) 250 mg tablet Take 3 tablets (750 mg total) by mouth 2 (two) times a day for 7 days, THEN 4 tablets (1,000 mg total) 2 (two) times a day. 762 tablet 0  . cholecalciferol (Vitamin D3) 25 mcg (1,000 unit) cap Take 1,000 Units by mouth daily.    . cyanocobalamin  (VITAMIN B12) 1,000 mcg tablet Take 1,000 mcg by mouth daily.    . diphenhydrAMINE (BENADRYL) 25 mg tablet Take 25 mg by mouth.    . escitalopram (LEXAPRO) 20 mg tablet     . gabapentin (NEURONTIN) 300 mg capsule Take 300 mg by mouth at bedtime.    . hydrOXYzine (ATARAX) 50 mg tablet Take 50 mg by mouth.    . QUEtiapine (SEROquel) 25 mg tablet Take 25 mg by mouth nightly.    . ubrogepant  (UBRELVY ) 100 mg tab tablet Take 100mg  at onset of headache, repeat 2hr later if headache persists. Do not exceed 200mg  in 24hr. 16 tablet 6   No facility-administered encounter medications on file as of 06/08/2023.    Referring physician: No referring provider defined for this encounter.  REVIEW  OF SYSTEMS: ROS   Positive for: Eyes Last edited by Nidia Bartley Hutchinson, COA on 06/08/2023  2:20 PM.      ALLERGIES Allergies  Allergen Reactions  . Sulfa (Sulfonamide Antibiotics)   . Sulfamethoxazole Swelling  . Triptans-5-Ht1 Antimigraine Agents GI Intolerance    PAST MEDICAL HISTORY Past Medical History:  Diagnosis Date  . Anxiety   . CTS (carpal tunnel syndrome)   . Headache, tension-type   . History of migraine   . Increased heart rate   . Migraine   . Trauma    Past Surgical  History:  Procedure Laterality Date  . TUBAL LIGATION     Procedure: TUBAL LIGATION    FAMILY HISTORY Family History  Adopted: Yes  Problem Relation Name Age of Onset  . Breast cancer Mother    . Cancer Mother         Lung Cancer  . Heart attack Father      SOCIAL HISTORY Social History   Tobacco Use  . Smoking status: Never  . Smokeless tobacco: Never  Vaping Use  . Vaping status: Never Used  Substance Use Topics  . Alcohol use: No  . Drug use: No        OPHTHALMIC EXAM:  Base Eye Exam     Visual Acuity (Snellen - Linear)       Right Left   Dist cc 20/25 20/25    Correction: Glasses         Tonometry (Tonopen: Tetracaine OU, 2:28 PM)       Right Left   Pressure 20 19         Pupils       Pupils   Right PERRL   Left PERRL         Extraocular Movement       Right Left    Full Full         Neuro/Psych     Oriented x3: Yes   Mood/Affect: Normal           Slit Lamp and Fundus Exam     External Exam       Right Left   External Normal Normal         Slit Lamp Exam       Right Left   Lids/Lashes Normal Normal   Conjunctiva/Sclera White and quiet White and quiet   Cornea Clear Clear   Anterior Chamber Deep and quiet Deep and quiet   Iris Round and reactive Round and reactive   Lens Clear Clear   Anterior Vitreous Normal Normal         Fundus Exam       Right Left   Disc Grade 1 disc edema worst superior Grade 1 disc edema worst temporal   C/D Ratio 0.1 0.1   Macula Normal Normal   Vessels Normal Normal   Periphery Normal Normal              IMAGING AND PROCEDURES:  OCT RNFL - OU - Both Eyes       OCT RNFL Interpretation  Test Date: 06/08/2023.   Right Eye  Patient Cooperation: good.   Left Eye  Patient Cooperation: good.   Notes OU: edema, slightly improved from prior, with borderline temporal thinning OS.       Fundus Photography, Optos - OU - Both Eyes       Fundus Photography  Interpretation, Both Eyes  Test Date: 06/08/2023.   Right Eye  Patient Cooperation:  good.   Left Eye  Patient Cooperation: good.   Notes Consistent with documented exam.     Humphrey Visual Field - OU - Both Eyes       Low reliability, peripheral artifacts, no true visual deficits.           ASSESSMENT/PLAN:  Sheila Rush is a 53 y.o. female who presents with:   #Idiopathic Intracranial Hypertension The patient's history and physical exam are consistent with papilledema. The most common cause of papilledema is IIH  - Patient reports headaches with n/v, transient visual obscurations, pulsatile tinnitus, sometimes triggered by valsalva maneuvers. - The patient's BMI is 37 - Review of meds negative for oral contraceptives, tetracyclines, cyclosporine, vitamin A, amiodarone, sulfa abx, lithium, systemic steroids - 12/06/22 last visit with Neurology - Meds include Diamox (initiated 12/06/22) currently 500mg  BID, ubrogepant  PRN, magnesium 400mg  daily, metoprolol  XL 100mg  daily, gabapentin 100mg  TID, Hydroxyzine 50mg  nightly, ibuprofen and aspirin . - Reported MRA in 2020 normal, without aneurysm. - 03/07/23 CT head with expanded relatively empty sella progressed since 2020. - HVF normal  - OCT RNFLwith mild disc edema OS>OD - 06/08/23: recent increase in diamox from 750mg  daily to 1000mg  daily has improved symptoms. Patient still has mild persistent disc edema. PLAN - Discussed with patient importance of weight reduction - Discussed that if edema does not improve it may be an indication for more aggressive management to prevent long-term vision loss. - Continue follow-up with neurology for medical management. I will send Dr. Malvina a message. - Follow-up with ophthalmology 3 months for repeat HVF, OCT RFNL - May consider referral to neurosurgery for CSF diversion procedure if ICP unable to be adequately controlled with oral medications and weight loss.    Return  for 3 months, IIH f/u, Powell, HVF 30-2 and OCT RNFL.  Explained the diagnoses, plan, and follow up with the patient and they expressed understanding.  Patient expressed understanding of the importance of proper follow up care.    I have reviewed the portions of the history and exam completed by the technician, including review of systems, and agree.   Chyrl Monte, MD Ophthalmology    Ophthalmic Meds/Labs Ordered this visit:  Orders Placed This Encounter  Procedures  . OCT RNFL - OU - Both Eyes  . Fundus Photography, Optos - OU - Both Eyes  . Humphrey Visual Field - OU - Both Eyes          There are no Patient Instructions on file for this visit.     Abbreviations: M myopia (nearsighted); A astigmatism; H hyperopia (farsighted); P presbyopia; Mrx spectacle prescription;  CTL contact lenses; OD right eye; OS left eye; OU both eyes  XT exotropia; ET esotropia; PEK punctate epithelial keratitis; PEE punctate epithelial erosions; DES dry eye syndrome; MGD meibomian gland dysfunction; ATs artificial tears; PFAT's preservative free artificial tears; NSC nuclear sclerotic cataract; PSC posterior subcapsular cataract; ERM epi-retinal membrane; PVD posterior vitreous detachment; RD retinal detachment; DM diabetes mellitus; DR diabetic retinopathy; NPDR non-proliferative diabetic retinopathy; PDR proliferative diabetic retinopathy; CSME clinically significant macular edema; DME diabetic macular edema; dbh dot blot hemorrhages; CWS cotton wool spot; POAG primary open angle glaucoma; C/D cup-to-disc ratio; HVF humphrey visual field; GVF goldmann visual field; OCT optical coherence tomography; IOP intraocular pressure; BRVO Branch retinal vein occlusion; CRVO central retinal vein occlusion; CRAO central retinal artery occlusion; BRAO branch retinal artery occlusion; RT retinal tear; SB scleral buckle; PPV pars plana vitrectomy; VH Vitreous hemorrhage; PRP panretinal laser photocoagulation; IVK  intravitreal kenalog; VMT vitreomacular traction; MH Macular hole;  NVD neovascularization of the disc; NVE neovascularization elsewhere; AREDS age related eye disease study; ARMD age related macular degeneration; POAG primary open angle glaucoma; EBMD epithelial/anterior basement membrane dystrophy; ACIOL anterior chamber intraocular lens; IOL intraocular lens; PCIOL posterior chamber intraocular lens; Phaco/IOL phacoemulsification with intraocular lens placement; PRK photorefractive keratectomy; LASIK laser assisted in situ keratomileusis; HTN hypertension; DM diabetes mellitus; COPD chronic obstructive pulmonary disease

## 2023-06-12 NOTE — Progress Notes (Signed)
 I reviewed imaging / testing and agree with resident interpretation.  I have reviewed the resident eye exam and agree with the resident assessment and plan.  Addie DOROTHA Redfern, MD, MS, FACS Leita Ruth and Sherree KYM Moose, MD Chair of Pediatric Ophthalmology and Adult Strabismus Service Professor of Ophthalmology and Pediatrics Associate Ophthalmology Residency Program Director Attending Physician, [facility] 06/12/2023 8:00 PM

## 2023-08-26 DIAGNOSIS — L309 Dermatitis, unspecified: Secondary | ICD-10-CM | POA: Diagnosis not present

## 2023-08-27 DIAGNOSIS — Z6830 Body mass index (BMI) 30.0-30.9, adult: Secondary | ICD-10-CM | POA: Diagnosis not present

## 2023-08-27 DIAGNOSIS — L299 Pruritus, unspecified: Secondary | ICD-10-CM | POA: Diagnosis not present

## 2023-08-27 DIAGNOSIS — R21 Rash and other nonspecific skin eruption: Secondary | ICD-10-CM | POA: Diagnosis not present

## 2023-08-27 DIAGNOSIS — T7840XA Allergy, unspecified, initial encounter: Secondary | ICD-10-CM | POA: Diagnosis not present

## 2023-09-05 DIAGNOSIS — R5383 Other fatigue: Secondary | ICD-10-CM | POA: Diagnosis not present

## 2023-09-05 DIAGNOSIS — R21 Rash and other nonspecific skin eruption: Secondary | ICD-10-CM | POA: Diagnosis not present

## 2023-09-14 DIAGNOSIS — G932 Benign intracranial hypertension: Secondary | ICD-10-CM | POA: Diagnosis not present

## 2023-10-05 NOTE — Telephone Encounter (Signed)
 Last visit date:  05/03/23 Sheila Rush ) Next visit date: 10/24/23 ([place] video)  Please briefly describe your concern/what would like for the nurse to know?  Patient is calling stating that yesterday at work she had 3 separate episodes of pain lines in her brain. Afterwards she has been having difficulty with walking properly. Since she works for the [employer], the nurses at work checked her blood pressure (159/36 first time, second one was normal). They didn't allow her to drive home. They told her she would need a note from her doctor in order to return to work. She has made a video appointment for 10/24/23 with Leita Abbey and placed on a wait list for sooner. In the meantime, could she get a note to go back to work? This can be placed in the portal and she can print it out and give to them.  Name of Person Calling:  Sheila Rush Call back number:  [phone]  If a symptom call: Is this a new symptom?  yes How long has it been occurring? yesterday

## 2023-10-05 NOTE — Telephone Encounter (Signed)
 Contacted patient. Confirmed name and DOB.  Incoming escalation call:  Patient is calling stating that yesterday at work she had 3 separate episodes of pain lines in her brain. Afterwards she has been having difficulty with walking properly. Since she works for the [employer], the nurses at work checked her blood pressure (159/36 first time, second one was normal). They didn't allow her to drive home. They told her she would need a note from her doctor in order to return to work. She has made a video appointment for 10/24/23 with Leita Abbey and placed on a wait list for sooner.   Attempted to schedule sooner appt with Dr. Melia today at 1130.  Please advise.

## 2023-10-09 ENCOUNTER — Encounter (HOSPITAL_COMMUNITY): Payer: Self-pay

## 2023-10-09 ENCOUNTER — Emergency Department (HOSPITAL_COMMUNITY)

## 2023-10-09 ENCOUNTER — Other Ambulatory Visit: Payer: Self-pay

## 2023-10-09 ENCOUNTER — Emergency Department (HOSPITAL_COMMUNITY)
Admission: EM | Admit: 2023-10-09 | Discharge: 2023-10-09 | Disposition: A | Discharge status: Home / Self Care | Attending: Emergency Medicine | Admitting: Emergency Medicine

## 2023-10-09 DIAGNOSIS — I10 Essential (primary) hypertension: Secondary | ICD-10-CM | POA: Insufficient documentation

## 2023-10-09 DIAGNOSIS — E663 Overweight: Secondary | ICD-10-CM | POA: Diagnosis not present

## 2023-10-09 DIAGNOSIS — G932 Benign intracranial hypertension: Secondary | ICD-10-CM | POA: Diagnosis not present

## 2023-10-09 DIAGNOSIS — S0990XA Unspecified injury of head, initial encounter: Secondary | ICD-10-CM | POA: Diagnosis not present

## 2023-10-09 DIAGNOSIS — Z6826 Body mass index (BMI) 26.0-26.9, adult: Secondary | ICD-10-CM | POA: Diagnosis not present

## 2023-10-09 DIAGNOSIS — Z7982 Long term (current) use of aspirin: Secondary | ICD-10-CM | POA: Diagnosis not present

## 2023-10-09 DIAGNOSIS — R519 Headache, unspecified: Secondary | ICD-10-CM | POA: Diagnosis not present

## 2023-10-09 NOTE — ED Provider Triage Note (Signed)
 Emergency Medicine Provider Triage Evaluation Note  Sheila Rush , a 53 y.o. female  was evaluated in triage.  Pt complains of headache, this began last Thursday with a popping sensation to the posterior aspect of her head radiating to the front, occurred approximately 4 times, had blurry vision during the incident.  Has a history of intracranial hypertension, is currently on acetazolamide for this.  Reports that she is followed by [facility] neurology.  Saw PCP today who sent her to the ED for further evaluation of her head.  No weakness, no trouble with her speech, no vision problems reported today.  Review of Systems  Positive: Headache, vision changes Negative: Neck pain, fever  Physical Exam  BP 100/76 (BP Location: Right Arm)   Pulse 75   Temp 98.1 F (36.7 C)   Resp 17   Ht 5' 4 (1.626 m)   Wt 73 kg   SpO2 100%   BMI 27.62 kg/m  Gen:   Awake, no distress   Resp:  Normal effort  MSK:   Moves extremities without difficulty  Other:  No weakness noted, no numbness, no changes in speech.  Vitals are stable.  Medical Decision Making  Medically screening exam initiated at 1:30 PM.  Appropriate orders placed.  Sheila Rush was informed that the remainder of the evaluation will be completed by another provider, this initial triage assessment does not replace that evaluation, and the importance of remaining in the ED until their evaluation is complete.     Sheila Vosler, PA-C 10/09/23 1338

## 2023-10-09 NOTE — Telephone Encounter (Signed)
 Contacted patient. Confirmed name and DOB.  Patient informed triage RN that she found her a PCP, the appointment is today at 10:15am.  No further needs.

## 2023-10-09 NOTE — Discharge Instructions (Signed)
 Continue to take acetaminophen and Ubrelvy  as needed for headache.  In addition, you may try applying ice to the area where you are having the pain.

## 2023-10-09 NOTE — ED Provider Notes (Signed)
 [place] EMERGENCY DEPARTMENT AT [facility] Provider Note   CSN: [id number] Arrival date & time: 10/09/23  1226     Patient presents with: Headache   Sheila Rush is a 53 y.o. female.   The history is provided by the patient.  Headache  She has history of intracranial hypertension and states that she has been having an intermittent headache over the last 5 days.  On several occasions, she felt something pop in the left side of her neck, behind her left ear.  Pain would spread from there up to the left temporal area.  She has taken Ubrelvy  with temporary improvement, but headache has been waxing and waning.  Currently, she is without pain.  She denies photophobia and phonophobia.  She denies fever or chills.  She denies any visual changes.  She did see her primary care provider who recommended she come to the ED for further evaluation.  She states that her most recent visit with neurology stated that she was cleared from the standpoint of her intracranial hypertension.    Prior to Admission medications   Medication Sig Start Date End Date Taking? Authorizing Provider  aspirin  EC 81 MG tablet Take 1 tablet (81 mg total) by mouth daily. Swallow whole. 09/26/22   Krasowski, Robert J, MD  diphenhydrAMINE (BENADRYL) 25 MG tablet Take 25 mg by mouth every 6 (six) hours as needed for itching, allergies or sleep.    [provider]  gabapentin (NEURONTIN) 100 MG capsule Take 100 mg by mouth at bedtime.     [provider]  hydrOXYzine (ATARAX) 50 MG tablet Take 50 mg by mouth at bedtime as needed (Sleep). 08/25/22   [provider]  Ubrogepant  (UBRELVY ) 50 MG TABS Take 50 mg by mouth as needed (take one at onset of migraine, may repeat once after 2 hours, no more than 2 pills in 24 h.). 10/22/18   Athar, Saima, MD    Allergies: Imitrex [sumatriptan] and Sulfa antibiotics    Review of Systems  Neurological:  Positive for headaches.  All other systems  reviewed and are negative.   Updated Vital Signs BP 117/85 (BP Location: Right Arm)   Pulse 81   Temp 98.2 F (36.8 C)   Resp 17   Ht 5' 4 (1.626 m)   Wt 73 kg   SpO2 99%   BMI 27.62 kg/m   Physical Exam Vitals and nursing note reviewed.   53 year old female, resting comfortably and in no acute distress. Vital signs are normal. Oxygen saturation is 99%, which is normal. Head is normocephalic and atraumatic. PERRLA, EOMI. there is no tenderness to palpation of the insertion of the paracervical muscles or over the temporalis or frontalis muscles. Neck is nontender and supple. Lungs are clear without rales, wheezes, or rhonchi. Chest is nontender. Heart has regular rate and rhythm without murmur. Skin is warm and dry without rash. Neurologic: Awake and alert and oriented, cranial nerves are intact.  Strength is 5/5 in all 4 extremities.  Sensation is intact, finger-nose testing is normal.   Radiology: CT HEAD WO CONTRAST ( ) Result Date: 10/09/2023 CLINICAL DATA:  Head trauma EXAM: CT HEAD WITHOUT CONTRAST TECHNIQUE: Contiguous axial images were obtained from the base of the skull through the vertex without intravenous contrast. RADIATION DOSE REDUCTION: This exam was performed according to the departmental dose-optimization program which includes automated exposure control, adjustment of the mA and/or kV according to patient size and/or use of iterative reconstruction technique. COMPARISON:  03/07/2023 FINDINGS: Brain: No acute infarct or hemorrhage. Focal hypodensity left basal ganglia unchanged, consistent with dilated perivascular space or chronic lacunar infarct. Lateral ventricles and remaining midline structures are unremarkable. No acute extra-axial fluid collections. No mass effect. Vascular: No hyperdense vessel or unexpected calcification. Skull: Normal. Negative for fracture or focal lesion. Sinuses/Orbits: No acute finding. Other: None. IMPRESSION: 1. Stable head CT, no  acute intracranial process. Electronically Signed   By: Ozell Daring M.D.   On: 10/09/2023 14:59     Procedures   Medications Ordered in the ED - No data to display                                  Medical Decision Making  Intermittent headache over the last 5 days and patient with history of intracranial hypertension.  I have reviewed her past records, and note ophthalmology office visit on 09/14/2023 noted significant decrease in her papilledema.  CT scan of head shows no acute abnormality.  Have independently viewed the images, and agree with the radiologist's interpretation.  Headache seems benign.  I am discharging her with instructions to continue her current medications for management of her headache.  Return precautions discussed.     Final diagnoses:  Bad headache    ED Discharge Orders     None          Raford Lenis, MD 10/09/23 2350

## 2023-10-09 NOTE — ED Triage Notes (Signed)
 Pt has been having headaches for 6 days since hearing a pop in her neck and in her head.  Pt states that this caused temporary loss of vision in both eyes (6 days ago) pt continues to have HA, no visual changes

## 2023-10-09 NOTE — ED Triage Notes (Signed)
 Patient presents to ED with a headache she has had since Thursday. PCP referred her to the ED because he felt uncomfortable. Patient felt a pop behind her behind her left ear 3 times with a pain that shot up to the top of her head blurred vision happened with the pain. The 3rd time was worse. Patient states that headache is not severe its mild-moderate.   Patient has a history of intercranial hypertension Patient denies injury, LOC, denies numbness, weakness, tingling, vision changes.

## 2023-10-24 DIAGNOSIS — R519 Headache, unspecified: Secondary | ICD-10-CM | POA: Diagnosis not present

## 2023-10-31 DIAGNOSIS — E663 Overweight: Secondary | ICD-10-CM | POA: Diagnosis not present

## 2023-10-31 DIAGNOSIS — Z6826 Body mass index (BMI) 26.0-26.9, adult: Secondary | ICD-10-CM | POA: Diagnosis not present

## 2023-10-31 DIAGNOSIS — L259 Unspecified contact dermatitis, unspecified cause: Secondary | ICD-10-CM | POA: Diagnosis not present

## 2023-10-31 DIAGNOSIS — B372 Candidiasis of skin and nail: Secondary | ICD-10-CM | POA: Diagnosis not present
